# Patient Record
Sex: Male | Born: 1980 | Race: White | Hispanic: No | Marital: Single | Smoking: Current every day smoker
Health system: Southern US, Community
[De-identification: ages and names within clinical notes are randomized; demographics above are authoritative.]

---

## 2015-08-09 ENCOUNTER — Emergency Department (HOSPITAL_COMMUNITY): Payer: Self-pay

## 2015-08-09 ENCOUNTER — Encounter (HOSPITAL_COMMUNITY): Payer: Self-pay | Admitting: Emergency Medicine

## 2015-08-09 ENCOUNTER — Emergency Department (HOSPITAL_COMMUNITY)
Admission: EM | Admit: 2015-08-09 | Discharge: 2015-08-10 | Disposition: A | Discharge status: Home / Self Care | Payer: Self-pay | Attending: Emergency Medicine | Admitting: Emergency Medicine

## 2015-08-09 DIAGNOSIS — R55 Syncope and collapse: Secondary | ICD-10-CM | POA: Insufficient documentation

## 2015-08-09 DIAGNOSIS — S0993XA Unspecified injury of face, initial encounter: Secondary | ICD-10-CM | POA: Insufficient documentation

## 2015-08-09 DIAGNOSIS — T07XXXA Unspecified multiple injuries, initial encounter: Secondary | ICD-10-CM

## 2015-08-09 DIAGNOSIS — Y9289 Other specified places as the place of occurrence of the external cause: Secondary | ICD-10-CM | POA: Insufficient documentation

## 2015-08-09 DIAGNOSIS — Y9389 Activity, other specified: Secondary | ICD-10-CM | POA: Insufficient documentation

## 2015-08-09 DIAGNOSIS — S299XXA Unspecified injury of thorax, initial encounter: Secondary | ICD-10-CM | POA: Insufficient documentation

## 2015-08-09 DIAGNOSIS — T148 Other injury of unspecified body region: Secondary | ICD-10-CM | POA: Insufficient documentation

## 2015-08-09 DIAGNOSIS — S0990XA Unspecified injury of head, initial encounter: Secondary | ICD-10-CM | POA: Insufficient documentation

## 2015-08-09 DIAGNOSIS — Z720 Tobacco use: Secondary | ICD-10-CM | POA: Insufficient documentation

## 2015-08-09 DIAGNOSIS — R42 Dizziness and giddiness: Secondary | ICD-10-CM | POA: Insufficient documentation

## 2015-08-09 DIAGNOSIS — S0083XA Contusion of other part of head, initial encounter: Secondary | ICD-10-CM | POA: Insufficient documentation

## 2015-08-09 DIAGNOSIS — Y998 Other external cause status: Secondary | ICD-10-CM | POA: Insufficient documentation

## 2015-08-09 DIAGNOSIS — Z23 Encounter for immunization: Secondary | ICD-10-CM | POA: Insufficient documentation

## 2015-08-09 LAB — I-STAT CHEM 8, ED
BUN: 24 mg/dL — ABNORMAL HIGH (ref 6–20)
CALCIUM ION: 1.16 mmol/L (ref 1.12–1.23)
Chloride: 103 mmol/L (ref 101–111)
Creatinine, Ser: 1.1 mg/dL (ref 0.61–1.24)
GLUCOSE: 103 mg/dL — AB (ref 65–99)
HCT: 47 % (ref 39.0–52.0)
HEMOGLOBIN: 16 g/dL (ref 13.0–17.0)
Potassium: 3.5 mmol/L (ref 3.5–5.1)
Sodium: 140 mmol/L (ref 135–145)
TCO2: 22 mmol/L (ref 0–100)

## 2015-08-09 LAB — CBG MONITORING, ED: GLUCOSE-CAPILLARY: 102 mg/dL — AB (ref 65–99)

## 2015-08-09 MED ORDER — TETANUS-DIPHTH-ACELL PERTUSSIS 5-2.5-18.5 LF-MCG/0.5 IM SUSP
0.5000 mL | Freq: Once | INTRAMUSCULAR | Status: AC
  Administered 2015-08-09: 0.5 mL via INTRAMUSCULAR
  Filled 2015-08-09: qty 0.5

## 2015-08-09 MED ORDER — NAPROXEN 500 MG PO TABS: 500.0000 mg | ORAL_TABLET | Freq: Two times a day (BID) | ORAL | Status: AC

## 2015-08-09 MED ORDER — KETOROLAC TROMETHAMINE 60 MG/2ML IM SOLN
60.0000 mg | Freq: Once | INTRAMUSCULAR | Status: AC
  Administered 2015-08-09: 60 mg via INTRAMUSCULAR
  Filled 2015-08-09: qty 2
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
## 2015-08-09 NOTE — Discharge Instructions (Signed)
Recommend that you do not drive, operate heavy machinery, perform strenuous activity or heavy lifting for 1 week. Be sure to drink plenty of fluids and to eat regular meals. Take Naproxen as prescribed for headache. Follow up with a primary care doctor for recheck of symptoms. Return to the ED, as needed, if symptoms worsen.  Head Injury You have received a head injury. It does not appear serious at this time. Headaches and vomiting are common following head injury. It should be easy to awaken from sleeping. Sometimes it is necessary for you to stay in the emergency department for a while for observation. Sometimes admission to the hospital may be needed. After injuries such as yours, most problems occur within the first 24 hours, but side effects may occur up to 7-10 days after the injury. It is important for you to carefully monitor your condition and contact your health care provider or seek immediate medical care if there is a change in your condition. WHAT ARE THE TYPES OF HEAD INJURIES? Head injuries can be as minor as a bump. Some head injuries can be more severe. More severe head injuries include:  A jarring injury to the brain (concussion).  A bruise of the brain (contusion). This mean there is bleeding in the brain that can cause swelling.  A cracked skull (skull fracture).  Bleeding in the brain that collects, clots, and forms a bump (hematoma). WHAT CAUSES A HEAD INJURY? A serious head injury is most likely to happen to someone who is in a car wreck and is not wearing a seat belt. Other causes of major head injuries include bicycle or motorcycle accidents, sports injuries, and falls. HOW ARE HEAD INJURIES DIAGNOSED? A complete history of the event leading to the injury and your current symptoms will be helpful in diagnosing head injuries. Many times, pictures of the brain, such as CT or MRI are needed to see the extent of the injury. Often, an overnight hospital stay is necessary for  observation.  WHEN SHOULD I SEEK IMMEDIATE MEDICAL CARE?  You should get help right away if:  You have confusion or drowsiness.  You feel sick to your stomach (nauseous) or have continued, forceful vomiting.  You have dizziness or unsteadiness that is getting worse.  You have severe, continued headaches not relieved by medicine. Only take over-the-counter or prescription medicines for pain, fever, or discomfort as directed by your health care provider.  You do not have normal function of the arms or legs or are unable to walk.  You notice changes in the black spots in the center of the colored part of your eye (pupil).  You have a clear or bloody fluid coming from your nose or ears.  You have a loss of vision. During the next 24 hours after the injury, you must stay with someone who can watch you for the warning signs. This person should contact local emergency services (911 in the [place] if you have seizures, you become unconscious, or you are unable to wake up. HOW CAN I PREVENT A HEAD INJURY IN THE FUTURE? The most important factor for preventing major head injuries is avoiding motor vehicle accidents. To minimize the potential for damage to your head, it is crucial to wear seat belts while riding in motor vehicles. Wearing helmets while bike riding and playing collision sports (like football) is also helpful. Also, avoiding dangerous activities around the house will further help reduce your risk of head injury.  WHEN CAN I RETURN TO NORMAL  ACTIVITIES AND ATHLETICS? You should be reevaluated by your health care provider before returning to these activities. If you have any of the following symptoms, you should not return to activities or contact sports until 1 week after the symptoms have stopped:  Persistent headache.  Dizziness or vertigo.  Poor attention and concentration.  Confusion.  Memory problems.  Nausea or vomiting.  Fatigue or tire  easily.  Irritability.  Intolerant of bright lights or loud noises.  Anxiety or depression.  Disturbed sleep. MAKE SURE YOU:   Understand these instructions.  Will watch your condition.  Will get help right away if you are not doing well or get worse. Document Released: 11/30/2005 Document Revised: 12/05/2013 Document Reviewed: 08/07/2013 [facility] Patient Information 2015 [place], [place]. This information is not intended to replace advice given to you by your health care provider. Make sure you discuss any questions you have with your health care provider.

## 2015-08-09 NOTE — ED Notes (Signed)
Patient transported to CT 
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
## 2015-08-10 MED ORDER — HYDROCODONE-ACETAMINOPHEN 5-325 MG PO TABS
2.0000 | ORAL_TABLET | Freq: Once | ORAL | Status: AC
  Administered 2015-08-10: 2 via ORAL
  Filled 2015-08-10: qty 2
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
## 2015-08-13 ENCOUNTER — Emergency Department (HOSPITAL_COMMUNITY)
Admission: EM | Admit: 2015-08-13 | Discharge: 2015-08-13 | Disposition: A | Discharge status: Home / Self Care | Payer: Self-pay | Attending: Emergency Medicine | Admitting: Emergency Medicine

## 2015-08-13 ENCOUNTER — Encounter (HOSPITAL_COMMUNITY): Payer: Self-pay | Admitting: Emergency Medicine

## 2015-08-13 DIAGNOSIS — M545 Low back pain: Secondary | ICD-10-CM | POA: Insufficient documentation

## 2015-08-13 DIAGNOSIS — F1410 Cocaine abuse, uncomplicated: Secondary | ICD-10-CM | POA: Insufficient documentation

## 2015-08-13 DIAGNOSIS — R51 Headache: Secondary | ICD-10-CM | POA: Insufficient documentation

## 2015-08-13 DIAGNOSIS — Z720 Tobacco use: Secondary | ICD-10-CM | POA: Insufficient documentation

## 2015-08-13 DIAGNOSIS — R519 Headache, unspecified: Secondary | ICD-10-CM

## 2015-08-13 DIAGNOSIS — Z791 Long term (current) use of non-steroidal anti-inflammatories (NSAID): Secondary | ICD-10-CM | POA: Insufficient documentation

## 2015-08-13 LAB — CBC WITH DIFFERENTIAL/PLATELET
Basophils Absolute: 0 10*3/uL (ref 0.0–0.1)
Basophils Relative: 1 % (ref 0–1)
EOS ABS: 0.4 10*3/uL (ref 0.0–0.7)
EOS PCT: 4 % (ref 0–5)
HCT: 41.5 % (ref 39.0–52.0)
HEMOGLOBIN: 14.1 g/dL (ref 13.0–17.0)
LYMPHS ABS: 1.4 10*3/uL (ref 0.7–4.0)
Lymphocytes Relative: 17 % (ref 12–46)
MCH: 29.9 pg (ref 26.0–34.0)
MCHC: 34 g/dL (ref 30.0–36.0)
MCV: 87.9 fL (ref 78.0–100.0)
MONO ABS: 0.7 10*3/uL (ref 0.1–1.0)
MONOS PCT: 8 % (ref 3–12)
Neutro Abs: 5.9 10*3/uL (ref 1.7–7.7)
Neutrophils Relative %: 70 % (ref 43–77)
Platelets: 227 10*3/uL (ref 150–400)
RBC: 4.72 MIL/uL (ref 4.22–5.81)
RDW: 13.1 % (ref 11.5–15.5)
WBC: 8.4 10*3/uL (ref 4.0–10.5)

## 2015-08-13 LAB — BASIC METABOLIC PANEL
Anion gap: 8 (ref 5–15)
BUN: 11 mg/dL (ref 6–20)
CHLORIDE: 104 mmol/L (ref 101–111)
CO2: 25 mmol/L (ref 22–32)
CREATININE: 1.01 mg/dL (ref 0.61–1.24)
Calcium: 9.3 mg/dL (ref 8.9–10.3)
GFR calc Af Amer: >60 mL/min (ref >60)
GFR calc non Af Amer: >60 mL/min (ref >60)
GLUCOSE: 91 mg/dL (ref 65–99)
Potassium: 4 mmol/L (ref 3.5–5.1)
Sodium: 137 mmol/L (ref 135–145)

## 2015-08-13 LAB — RAPID URINE DRUG SCREEN, HOSP PERFORMED
AMPHETAMINES: NOT DETECTED
Barbiturates: NOT DETECTED
Benzodiazepines: NOT DETECTED
Cocaine: POSITIVE — AB
Opiates: NOT DETECTED
TETRAHYDROCANNABINOL: NOT DETECTED

## 2015-08-13 LAB — URINALYSIS, ROUTINE W REFLEX MICROSCOPIC
Bilirubin Urine: NEGATIVE
GLUCOSE, UA: NEGATIVE mg/dL
HGB URINE DIPSTICK: NEGATIVE
Ketones, ur: 15 mg/dL — AB
Leukocytes, UA: NEGATIVE
Nitrite: NEGATIVE
PROTEIN: NEGATIVE mg/dL
Specific Gravity, Urine: 1.023 (ref 1.005–1.030)
Urobilinogen, UA: 1 mg/dL (ref 0.0–1.0)
pH: 6 (ref 5.0–8.0)

## 2015-08-13 MED ORDER — HYDROCODONE-ACETAMINOPHEN 5-325 MG PO TABS
1.0000 | ORAL_TABLET | Freq: Once | ORAL | Status: AC
  Administered 2015-08-13: 1 via ORAL
  Filled 2015-08-13: qty 1

## 2015-08-13 MED ORDER — TRAMADOL-ACETAMINOPHEN 37.5-325 MG PO TABS: 1.0000 | ORAL_TABLET | Freq: Four times a day (QID) | ORAL | Status: AC | PRN
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
## 2015-08-13 NOTE — Discharge Instructions (Signed)
Your evaluated in the ED today for your symptoms there is not appear to be an emergent cause at this time. It is important to follow-up with health and wellness in order to establish primary care for further evaluation and management of your symptoms. It is important for you to avoid bright lights and loud sounds and try to rest. You may take your medicines as prescribed. Do not take these medicines before driving or operating machinery. Return to ED for worsening symptoms.  Headaches, Frequently Asked Questions MIGRAINE HEADACHES Q: What is migraine? What causes it? How can I treat it? A: Generally, migraine headaches begin as a dull ache. Then they develop into a constant, throbbing, and pulsating pain. You may experience pain at the temples. You may experience pain at the front or back of one or both sides of the head. The pain is usually accompanied by a combination of:  Nausea.  Vomiting.  Sensitivity to light and noise. Some people (about 15%) experience an aura (see below) before an attack. The cause of migraine is believed to be chemical reactions in the brain. Treatment for migraine may include over-the-counter or prescription medications. It may also include self-help techniques. These include relaxation training and biofeedback.  Q: What is an aura? A: About 15% of people with migraine get an "aura". This is a sign of neurological symptoms that occur before a migraine headache. You may see wavy or jagged lines, dots, or flashing lights. You might experience tunnel vision or blind spots in one or both eyes. The aura can include visual or auditory hallucinations (something imagined). It may include disruptions in smell (such as strange odors), taste or touch. Other symptoms include:  Numbness.  A "pins and needles" sensation.  Difficulty in recalling or speaking the correct word. These neurological events may last as long as 60 minutes. These symptoms will fade as the headache  begins. Q: What is a trigger? A: Certain physical or environmental factors can lead to or "trigger" a migraine. These include:  Foods.  Hormonal changes.  Weather.  Stress. It is important to remember that triggers are different for everyone. To help prevent migraine attacks, you need to figure out which triggers affect you. Keep a headache diary. This is a good way to track triggers. The diary will help you talk to your healthcare professional about your condition. Q: Does weather affect migraines? A: Bright sunshine, hot, humid conditions, and drastic changes in barometric pressure may lead to, or "trigger," a migraine attack in some people. But studies have shown that weather does not act as a trigger for everyone with migraines. Q: What is the link between migraine and hormones? A: Hormones start and regulate many of your body's functions. Hormones keep your body in balance within a constantly changing environment. The levels of hormones in your body are unbalanced at times. Examples are during menstruation, pregnancy, or menopause. That can lead to a migraine attack. In fact, about three quarters of all women with migraine report that their attacks are related to the menstrual cycle.  Q: Is there an increased risk of stroke for migraine sufferers? A: The likelihood of a migraine attack causing a stroke is very remote. That is not to say that migraine sufferers cannot have a stroke associated with their migraines. In persons under age 53, the most common associated factor for stroke is migraine headache. But over the course of a person's normal life span, the occurrence of migraine headache may actually be associated with a  reduced risk of dying from cerebrovascular disease due to stroke.  Q: What are acute medications for migraine? A: Acute medications are used to treat the pain of the headache after it has started. Examples over-the-counter medications, NSAIDs, ergots, and triptans.  Q:  What are the triptans? A: Triptans are the newest class of abortive medications. They are specifically targeted to treat migraine. Triptans are vasoconstrictors. They moderate some chemical reactions in the brain. The triptans work on receptors in your brain. Triptans help to restore the balance of a neurotransmitter called serotonin. Fluctuations in levels of serotonin are thought to be a main cause of migraine.  Q: Are over-the-counter medications for migraine effective? A: Over-the-counter, or "OTC," medications may be effective in relieving mild to moderate pain and associated symptoms of migraine. But you should see your caregiver before beginning any treatment regimen for migraine.  Q: What are preventive medications for migraine? A: Preventive medications for migraine are sometimes referred to as "prophylactic" treatments. They are used to reduce the frequency, severity, and length of migraine attacks. Examples of preventive medications include antiepileptic medications, antidepressants, beta-blockers, calcium channel blockers, and NSAIDs (nonsteroidal anti-inflammatory drugs). Q: Why are anticonvulsants used to treat migraine? A: During the past few years, there has been an increased interest in antiepileptic drugs for the prevention of migraine. They are sometimes referred to as "anticonvulsants". Both epilepsy and migraine may be caused by similar reactions in the brain.  Q: Why are antidepressants used to treat migraine? A: Antidepressants are typically used to treat people with depression. They may reduce migraine frequency by regulating chemical levels, such as serotonin, in the brain.  Q: What alternative therapies are used to treat migraine? A: The term "alternative therapies" is often used to describe treatments considered outside the scope of conventional Western medicine. Examples of alternative therapy include acupuncture, acupressure, and yoga. Another common alternative treatment is  herbal therapy. Some herbs are believed to relieve headache pain. Always discuss alternative therapies with your caregiver before proceeding. Some herbal products contain arsenic and other toxins. TENSION HEADACHES Q: What is a tension-type headache? What causes it? How can I treat it? A: Tension-type headaches occur randomly. They are often the result of temporary stress, anxiety, fatigue, or anger. Symptoms include soreness in your temples, a tightening band-like sensation around your head (a "vice-like" ache). Symptoms can also include a pulling feeling, pressure sensations, and contracting head and neck muscles. The headache begins in your forehead, temples, or the back of your head and neck. Treatment for tension-type headache may include over-the-counter or prescription medications. Treatment may also include self-help techniques such as relaxation training and biofeedback. CLUSTER HEADACHES Q: What is a cluster headache? What causes it? How can I treat it? A: Cluster headache gets its name because the attacks come in groups. The pain arrives with little, if any, warning. It is usually on one side of the head. A tearing or bloodshot eye and a runny nose on the same side of the headache may also accompany the pain. Cluster headaches are believed to be caused by chemical reactions in the brain. They have been described as the most severe and intense of any headache type. Treatment for cluster headache includes prescription medication and oxygen. SINUS HEADACHES Q: What is a sinus headache? What causes it? How can I treat it? A: When a cavity in the bones of the face and skull (a sinus) becomes inflamed, the inflammation will cause localized pain. This condition is usually the result of an  allergic reaction, a tumor, or an infection. If your headache is caused by a sinus blockage, such as an infection, you will probably have a fever. An x-ray will confirm a sinus blockage. Your caregiver's treatment  might include antibiotics for the infection, as well as antihistamines or decongestants.  REBOUND HEADACHES Q: What is a rebound headache? What causes it? How can I treat it? A: A pattern of taking acute headache medications too often can lead to a condition known as "rebound headache." A pattern of taking too much headache medication includes taking it more than 2 days per week or in excessive amounts. That means more than the label or a caregiver advises. With rebound headaches, your medications not only stop relieving pain, they actually begin to cause headaches. Doctors treat rebound headache by tapering the medication that is being overused. Sometimes your caregiver will gradually substitute a different type of treatment or medication. Stopping may be a challenge. Regularly overusing a medication increases the potential for serious side effects. Consult a caregiver if you regularly use headache medications more than 2 days per week or more than the label advises. ADDITIONAL QUESTIONS AND ANSWERS Q: What is biofeedback? A: Biofeedback is a self-help treatment. Biofeedback uses special equipment to monitor your body's involuntary physical responses. Biofeedback monitors:  Breathing.  Pulse.  Heart rate.  Temperature.  Muscle tension.  Brain activity. Biofeedback helps you refine and perfect your relaxation exercises. You learn to control the physical responses that are related to stress. Once the technique has been mastered, you do not need the equipment any more. Q: Are headaches hereditary? A: Four out of five (80%) of people that suffer report a family history of migraine. Scientists are not sure if this is genetic or a family predisposition. Despite the uncertainty, a child has a 50% chance of having migraine if one parent suffers. The child has a 75% chance if both parents suffer.  Q: Can children get headaches? A: By the time they reach high school, most young people have experienced  some type of headache. Many safe and effective approaches or medications can prevent a headache from occurring or stop it after it has begun.  Q: What type of doctor should I see to diagnose and treat my headache? A: Start with your primary caregiver. Discuss his or her experience and approach to headaches. Discuss methods of classification, diagnosis, and treatment. Your caregiver may decide to recommend you to a headache specialist, depending upon your symptoms or other physical conditions. Having diabetes, allergies, etc., may require a more comprehensive and inclusive approach to your headache. The National Head[employer] Foundation will provide, upon request, a list of [facility] physician members in your state. Document Released: 02/20/2004 Document Revised: 02/22/2012 Document Reviewed: 07/30/2008 [facility] Patient Information 2015 [place], [place]. This information is not intended to replace advice given to you by your health care provider. Make sure you discuss any questions you have with your health care provider.
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
